# Patient Record
Sex: Female | Born: 1935 | Race: White | Hispanic: No
Health system: Southern US, Community
[De-identification: ages and names within clinical notes are randomized; demographics above are authoritative.]

---

## 2014-06-25 ENCOUNTER — Ambulatory Visit (HOSPITAL_COMMUNITY)
Admission: RE | Admit: 2014-06-25 | Discharge: 2014-06-25 | Disposition: A | Discharge status: Home / Self Care | Payer: Medicare Other | Source: Ambulatory Visit | Attending: Internal Medicine | Admitting: Internal Medicine

## 2014-06-25 ENCOUNTER — Other Ambulatory Visit (HOSPITAL_COMMUNITY): Payer: Self-pay | Admitting: Internal Medicine

## 2014-06-25 DIAGNOSIS — M79642 Pain in left hand: Secondary | ICD-10-CM

## 2014-06-25 DIAGNOSIS — R609 Edema, unspecified: Secondary | ICD-10-CM

## 2014-06-25 DIAGNOSIS — M25532 Pain in left wrist: Secondary | ICD-10-CM

## 2014-06-25 DIAGNOSIS — M24549 Contracture, unspecified hand: Secondary | ICD-10-CM | POA: Insufficient documentation

## 2014-06-25 DIAGNOSIS — M25539 Pain in unspecified wrist: Secondary | ICD-10-CM | POA: Insufficient documentation

## 2014-06-25 DIAGNOSIS — M25439 Effusion, unspecified wrist: Secondary | ICD-10-CM | POA: Insufficient documentation

## 2014-07-14 ENCOUNTER — Other Ambulatory Visit: Payer: Self-pay | Admitting: Orthopaedic Surgery

## 2014-07-14 DIAGNOSIS — M25512 Pain in left shoulder: Secondary | ICD-10-CM

## 2014-07-22 ENCOUNTER — Ambulatory Visit
Admission: RE | Admit: 2014-07-22 | Discharge: 2014-07-22 | Disposition: A | Discharge status: Home / Self Care | Payer: Medicare Other | Source: Ambulatory Visit | Attending: Orthopaedic Surgery | Admitting: Orthopaedic Surgery

## 2014-07-22 DIAGNOSIS — M25512 Pain in left shoulder: Secondary | ICD-10-CM

## 2014-11-25 DEATH — deceased

## 2016-01-02 IMAGING — CT CT SHOULDER*L* W/O CM
3 of 4 series · 7 of 14 positions shown, 8 images · non-contrast
Comparison: Portable chest 07/20/2008. Left humerus radiographs
03/07/2007.

CLINICAL DATA: Left shoulder pain. Personal history of Alzheimer's
disease.

EXAM:
CT OF THE LEFT SHOULDER WITHOUT CONTRAST
TECHNIQUE: Multidetector CT imaging was performed according to the standard
protocol. Multiplanar CT image reconstructions were also generated.

[Series 10: shoulder soft · axial · 0.36mm/px · z∈[-125,-68]mm · 2 of 70 slices shown]
[im 24/70  soft-tissue]
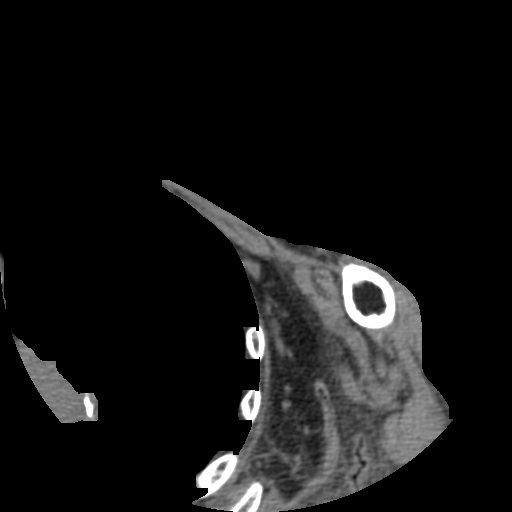
[im 47/70  soft-tissue]
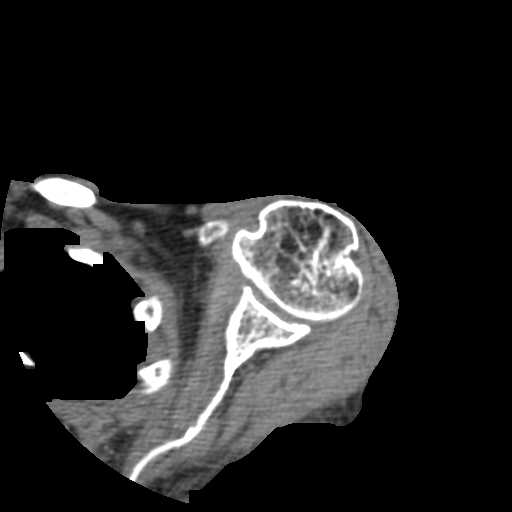

[Series 700: sag bone · axial · 0.36mm/px · z∈[-96,-4]mm · 3 of 62 slices shown, 4 images]
[im 1/62  soft-tissue]
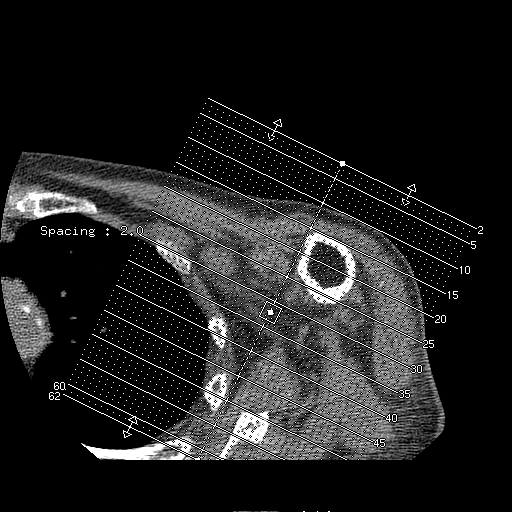
[im 1/62  bone]
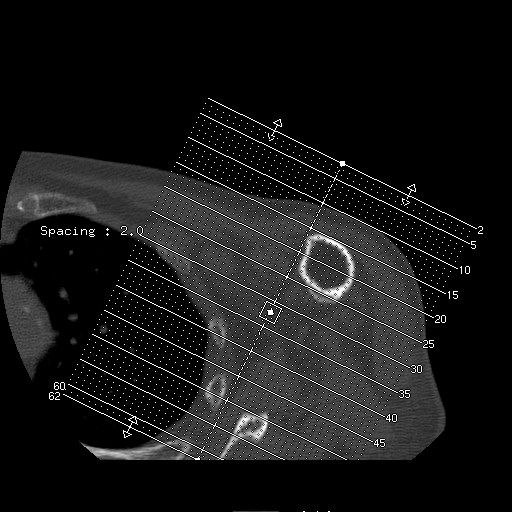
[im 31/62  bone]
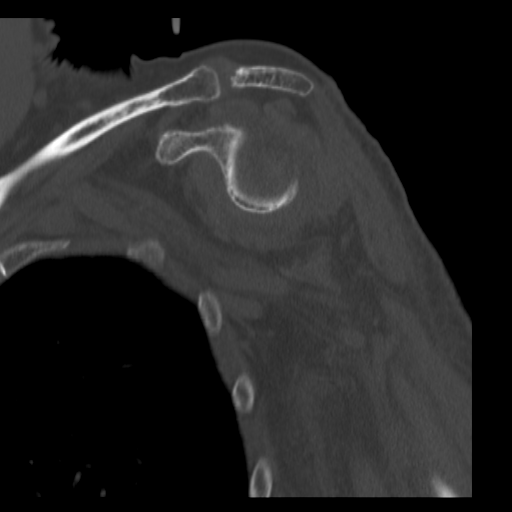
[im 62/62  bone]
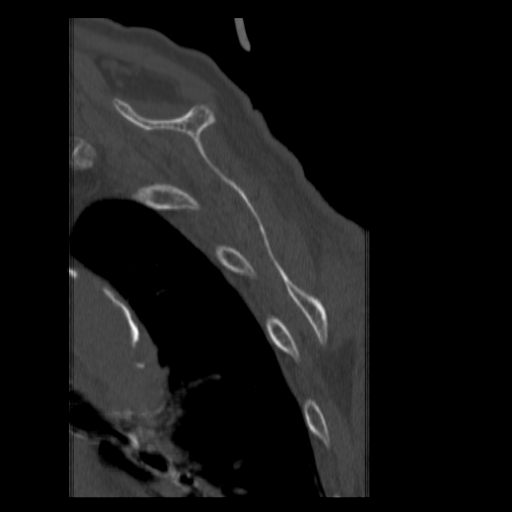

[Series 800: cor soft · sagittal · 0.36mm/px · 2 of 76 slices shown]
[im 26/76  soft-tissue]
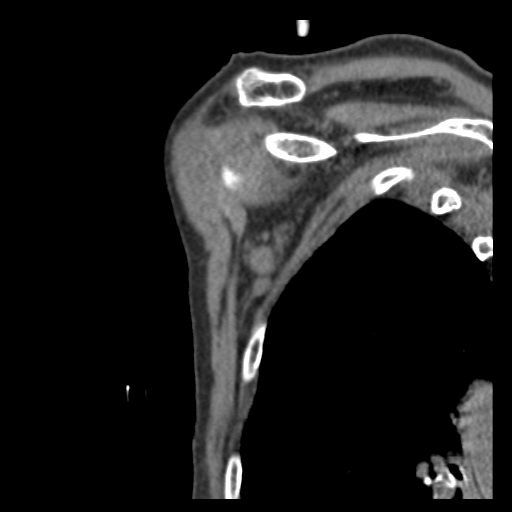
[im 51/76  soft-tissue]
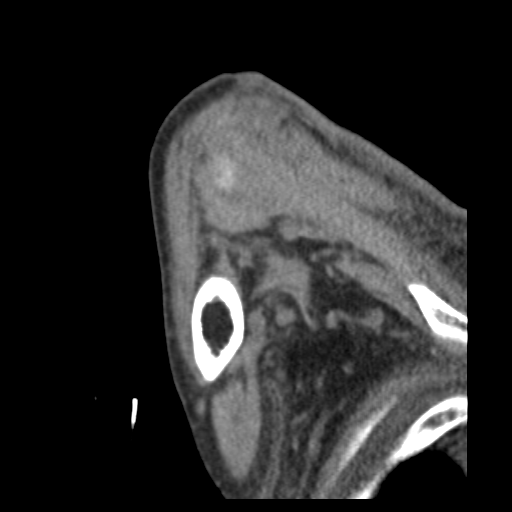

[7 of 14 positions shown; findings below may reference images not displayed]

FINDINGS: There is chronic posttraumatic deformity of the proximal left
humerus consistent with an old healed humeral neck fracture. This
appears unchanged from prior radiographs. There is no evidence of
acute fracture or dislocation. There are minor glenohumeral
degenerative changes on the left. The left scapula and clavicle
appear normal.

Soft tissue windows demonstrate generalized muscular atrophy
surrounding the shoulder. The subacromial space does not appear
significantly narrowed.

The scout image demonstrates posttraumatic deformity of the proximal
right humerus, also stable compared with prior radiographs.
IMPRESSION: 1. Stable remote fractures of both humeral necks with resulting
posttraumatic deformity.
2. No acute osseous findings.
3. No acute soft tissue findings identified. There is nonspecific
generalized muscular atrophy surrounding the left shoulder.
